# Patient Record
Sex: Female | Born: 2000 | Race: White | Hispanic: Yes | Marital: Single | Smoking: Never smoker
Health system: Southern US, Community
[De-identification: ages and names within clinical notes are randomized; demographics above are authoritative.]

## PROBLEM LIST (undated history)

## (undated) ENCOUNTER — Ambulatory Visit: Payer: Self-pay | Source: Home / Self Care

## (undated) DIAGNOSIS — Z789 Other specified health status: Secondary | ICD-10-CM

---
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
## 2019-07-03 IMAGING — CR DG CHEST 2V
2 series · 2 of 2 positions shown · non-contrast
Comparison: none

CLINICAL DATA: Cough several days, crackles on right heard on exam

EXAM:
CHEST - 2 VIEW

[w chest pa]
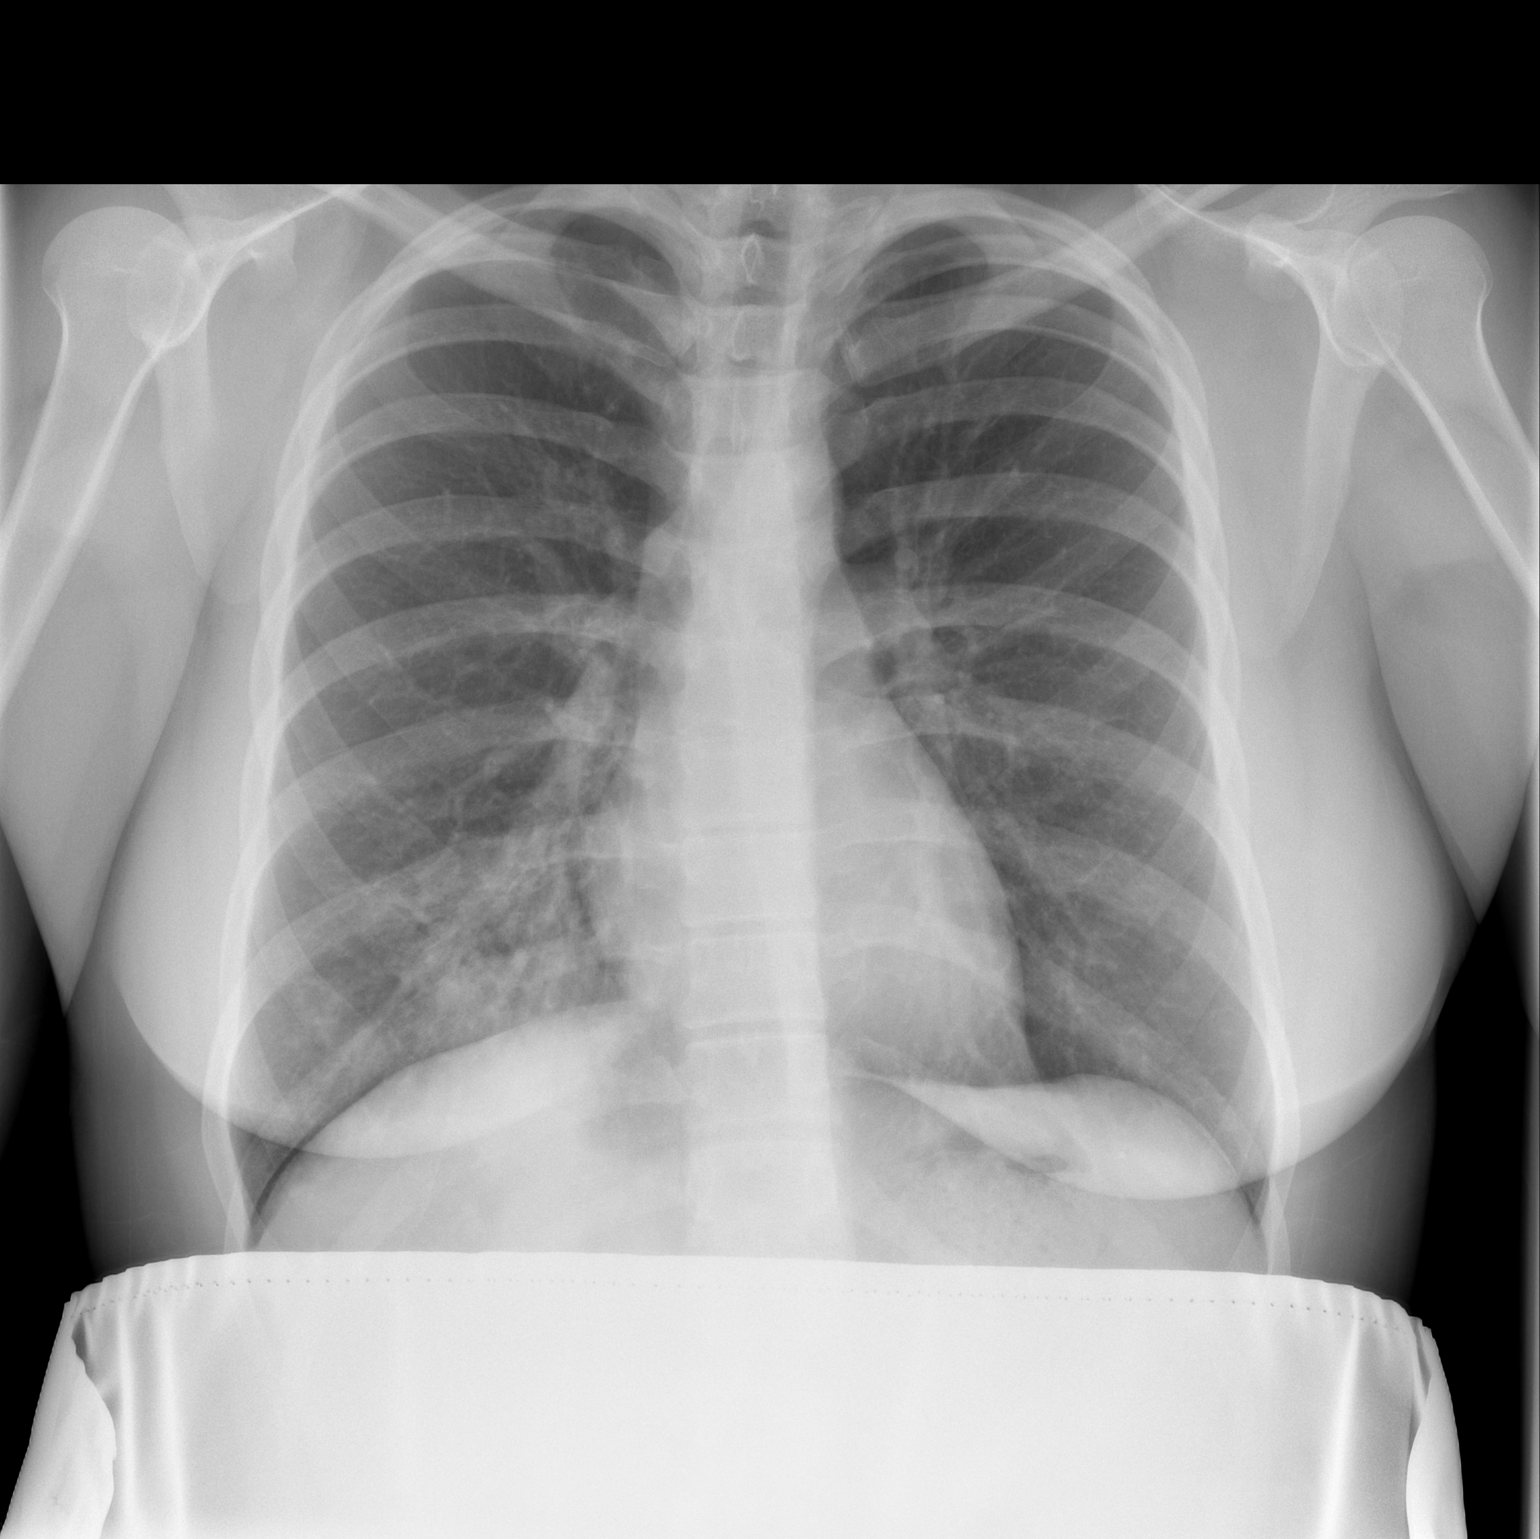

[w chest lat]
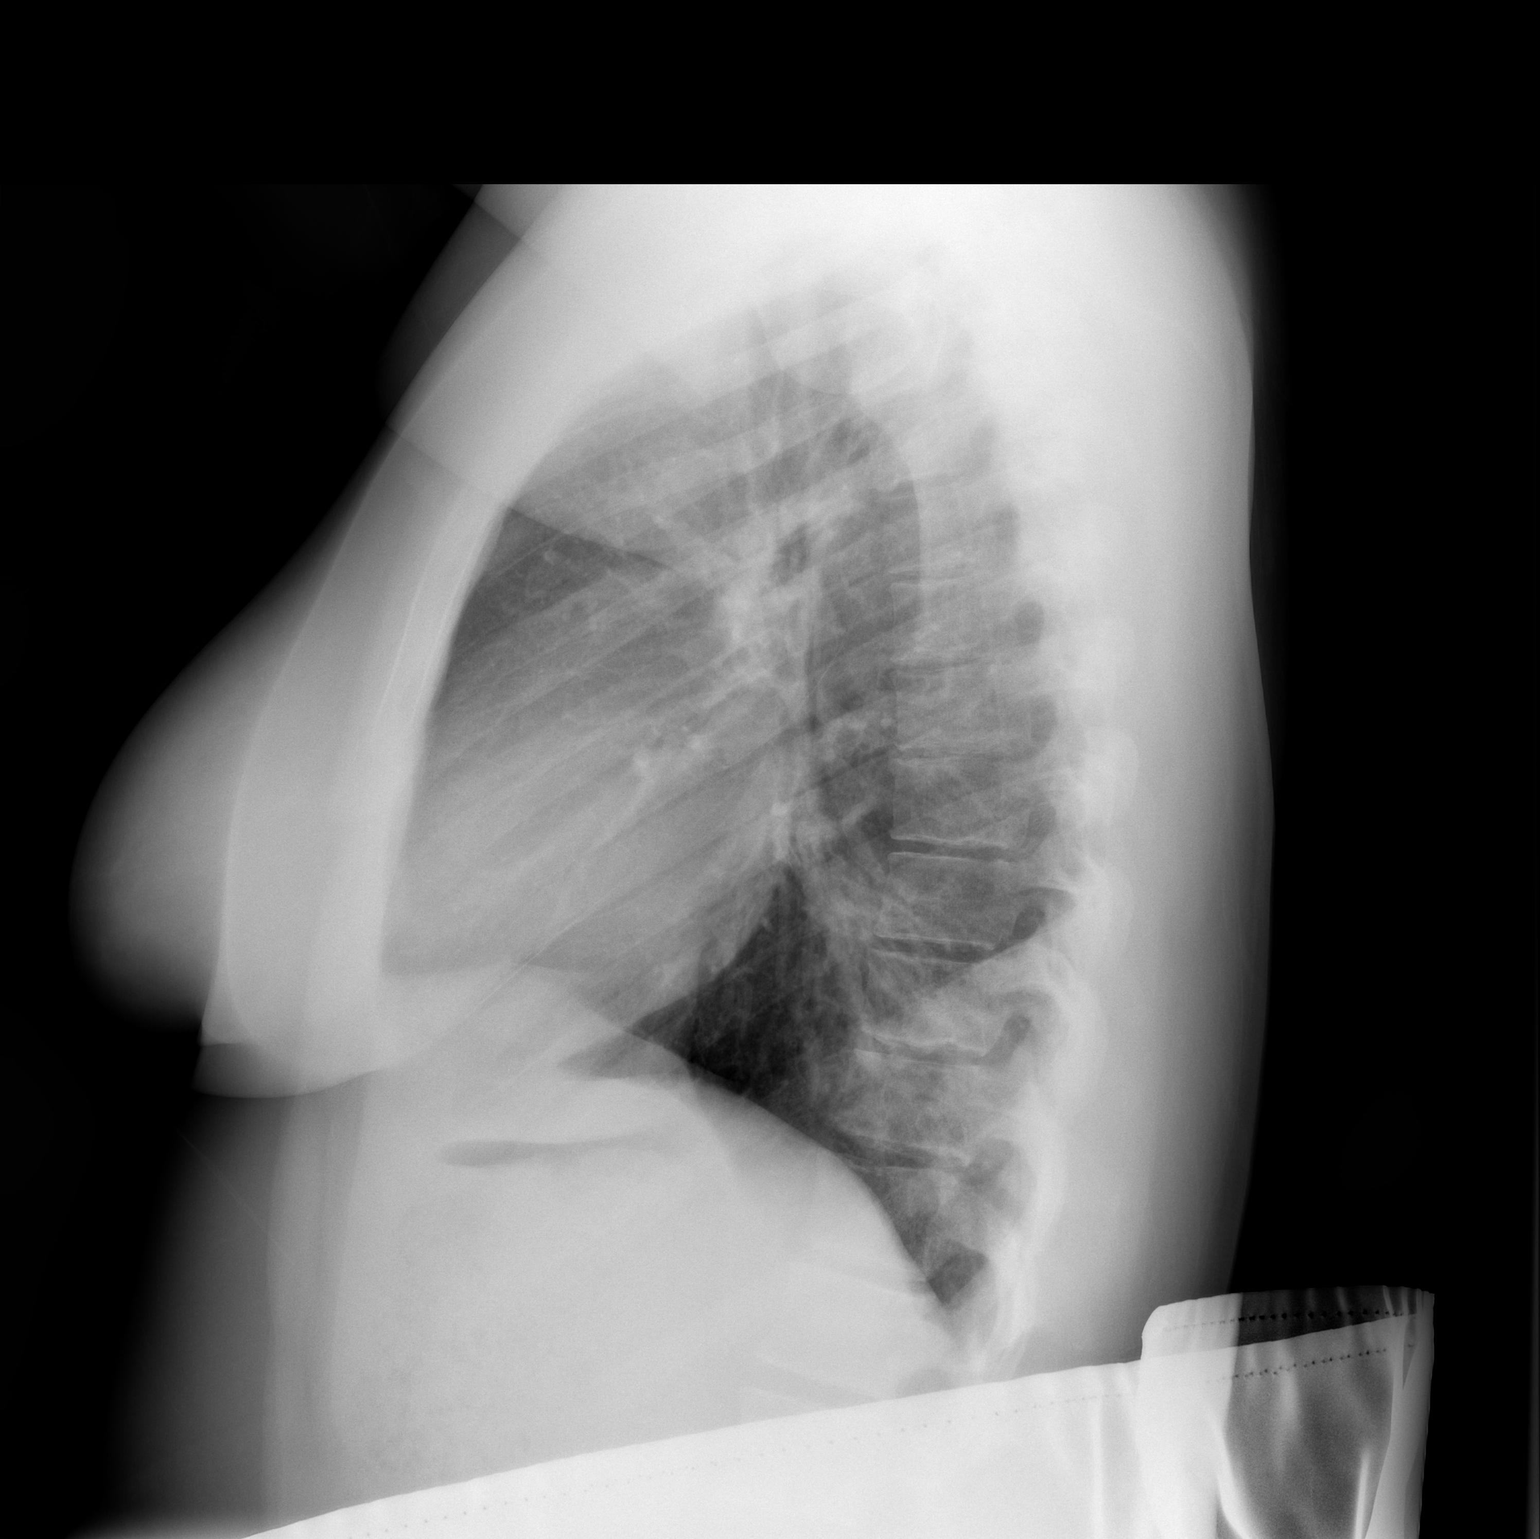

[2 of 2 positions shown; findings below may reference images not displayed]

FINDINGS: Airspace consolidation in the posterior basal segment right lower
lobe. Left lung clear.

Heart size and mediastinal contours are within normal limits.

No effusion.

Visualized bones unremarkable.
IMPRESSION: Posterior right lower lobe pneumonia

## 2020-07-07 ENCOUNTER — Emergency Department (HOSPITAL_COMMUNITY)
Admission: EM | Admit: 2020-07-07 | Discharge: 2020-07-07 | Disposition: A | Discharge status: Home / Self Care | Payer: 59 | Attending: Emergency Medicine | Admitting: Emergency Medicine

## 2020-07-07 ENCOUNTER — Encounter (HOSPITAL_COMMUNITY): Payer: Self-pay | Admitting: Pharmacy Technician

## 2020-07-07 DIAGNOSIS — K0889 Other specified disorders of teeth and supporting structures: Secondary | ICD-10-CM | POA: Diagnosis present

## 2020-07-07 MED ORDER — PENICILLIN V POTASSIUM 500 MG PO TABS: 500.0000 mg | ORAL_TABLET | Freq: Four times a day (QID) | ORAL | 0 refills | Status: AC

## 2020-07-07 NOTE — ED Provider Notes (Signed)
[facility] EMERGENCY DEPARTMENT Provider Note   CSN: [id number] Arrival date & time: 07/07/20  1716     History Chief Complaint  Patient presents with  . Dental Pain    Michaela Robinson is a 20 y.o. female.  20 year old female presents with complaint of right and left lower jaw pain, states her wisdom teeth are breaking through and are very painful.  Denies swelling, fevers, injury.        Past Medical History:  Diagnosis Date  . Medical history non-contributory     Patient Active Problem List   Diagnosis Date Noted  . GAD (generalized anxiety disorder) 12/25/2017  . MDD (major depressive disorder) 12/25/2017  . Severe recurrent major depression without psychotic features (HCC) 08/17/2017    No past surgical history on file.   OB History   No obstetric history on file.     No family history on file.  Social History   Tobacco Use  . Smoking status: Never Smoker  . Smokeless tobacco: Never Used  Vaping Use  . Vaping Use: Never used  Substance Use Topics  . Alcohol use: Not Currently  . Drug use: Not Currently    Home Medications Prior to Admission medications   Medication Sig Start Date End Date Taking? Authorizing Provider  penicillin v potassium (VEETID) 500 MG tablet Take 1 tablet (500 mg total) by mouth 4 (four) times daily for 10 days. 07/07/20 07/17/20 Yes Jeannie Fend, PA-C  hydrOXYzine (VISTARIL) 25 MG capsule Take 1 capsule (25 mg total) by mouth every 8 (eight) hours as needed. 10/18/18   Cherie Ouch, PA-C    Allergies    Patient has no known allergies.  Review of Systems   Review of Systems  Constitutional: Negative for chills and fever.  HENT: Positive for dental problem. Negative for trouble swallowing and voice change.   Gastrointestinal: Negative for vomiting.  Musculoskeletal: Negative for neck pain and neck stiffness.  Skin: Negative for rash and wound.  Allergic/Immunologic: Negative for immunocompromised  state.  Neurological: Negative for headaches.  Hematological: Negative for adenopathy.  Psychiatric/Behavioral: Negative for confusion.  All other systems reviewed and are negative.   Physical Exam Updated Vital Signs BP 131/85 (BP Location: Right Arm)   Pulse (!) 106   Temp 98.2 F (36.8 C) (Oral)   Resp 15   SpO2 100%   Physical Exam Vitals and nursing note reviewed.  Constitutional:      General: She is not in acute distress.    Appearance: She is well-developed. She is not diaphoretic.  HENT:     Head: Normocephalic and atraumatic.     Jaw: No trismus.     Nose: Nose normal.     Mouth/Throat:     Mouth: Mucous membranes are moist.     Pharynx: No oropharyngeal exudate or posterior oropharyngeal erythema.     Comments: Right and left lower wisdom teeth visible, no obvious abscess Pulmonary:     Effort: Pulmonary effort is normal.  Musculoskeletal:     Cervical back: Neck supple.  Lymphadenopathy:     Cervical: No cervical adenopathy.  Skin:    General: Skin is warm and dry.  Neurological:     Mental Status: She is alert and oriented to person, place, and time.  Psychiatric:        Behavior: Behavior normal.     ED Results / Procedures / Treatments   Labs (all labs ordered are listed, but only abnormal results are displayed)  Labs Reviewed - No data to display  EKG None  Radiology No results found.  Procedures Procedures   Medications Ordered in ED Medications - No data to display  ED Course  I have reviewed the triage vital signs and the nursing notes.  Pertinent labs & imaging results that were available during my care of the patient were reviewed by me and considered in my medical decision making (see chart for details).  Clinical Course as of 07/07/20 1805  Mon Jul 07, 2020  3278 20 year old female with complaint of dental pain without fever, trauma, drainage.  Pain appears related to eruption of lower wisdom teeth.  Patient is referred to oral  surgery with dental referral guide.  Given prescription for penicillin. [LM]    Clinical Course User Index [LM] Alden Hipp   MDM Rules/Calculators/A&P                          Final Clinical Impression(s) / ED Diagnoses Final diagnoses:  Pain, dental    Rx / DC Orders ED Discharge Orders         Ordered    penicillin v potassium (VEETID) 500 MG tablet  4 times daily        07/07/20 1753           Jeannie Fend, PA-C 07/07/20 1805    Milagros Loll, MD 07/08/20 2134

## 2020-07-07 NOTE — ED Notes (Signed)
Patient Alert and oriented to baseline. Stable and ambulatory to baseline. Patient verbalized understanding of the discharge instructions.  Patient belongings were taken by the patient.   

## 2020-07-07 NOTE — ED Triage Notes (Signed)
Pt here with R sided dental pain to where her wisdom teeth were. Pt states she was supposed to have the pulled a while ago.

## 2020-11-21 ENCOUNTER — Other Ambulatory Visit: Payer: Self-pay

## 2020-11-21 ENCOUNTER — Emergency Department (HOSPITAL_COMMUNITY)
Admission: EM | Admit: 2020-11-21 | Discharge: 2020-11-21 | Disposition: A | Discharge status: Home / Self Care | Payer: 59 | Attending: Physician Assistant | Admitting: Physician Assistant

## 2020-11-21 ENCOUNTER — Encounter (HOSPITAL_COMMUNITY): Payer: Self-pay

## 2020-11-21 DIAGNOSIS — N939 Abnormal uterine and vaginal bleeding, unspecified: Secondary | ICD-10-CM | POA: Diagnosis present

## 2020-11-21 DIAGNOSIS — R110 Nausea: Secondary | ICD-10-CM | POA: Insufficient documentation

## 2020-11-21 DIAGNOSIS — Z5321 Procedure and treatment not carried out due to patient leaving prior to being seen by health care provider: Secondary | ICD-10-CM | POA: Diagnosis not present

## 2020-11-21 DIAGNOSIS — N9489 Other specified conditions associated with female genital organs and menstrual cycle: Secondary | ICD-10-CM | POA: Diagnosis not present

## 2020-11-21 DIAGNOSIS — R519 Headache, unspecified: Secondary | ICD-10-CM | POA: Diagnosis not present

## 2020-11-21 DIAGNOSIS — R109 Unspecified abdominal pain: Secondary | ICD-10-CM | POA: Insufficient documentation

## 2020-11-21 LAB — CBC WITH DIFFERENTIAL/PLATELET
Abs Immature Granulocytes: 0.02 10*3/uL (ref 0.00–0.07)
Basophils Absolute: 0.1 10*3/uL (ref 0.0–0.1)
Basophils Relative: 1 %
Eosinophils Absolute: 0.1 10*3/uL (ref 0.0–0.5)
Eosinophils Relative: 1 %
HCT: 38.5 % (ref 36.0–46.0)
Hemoglobin: 11.8 g/dL — ABNORMAL LOW (ref 12.0–15.0)
Immature Granulocytes: 0 %
Lymphocytes Relative: 29 %
Lymphs Abs: 2.4 10*3/uL (ref 0.7–4.0)
MCH: 25.4 pg — ABNORMAL LOW (ref 26.0–34.0)
MCHC: 30.6 g/dL (ref 30.0–36.0)
MCV: 82.8 fL (ref 80.0–100.0)
Monocytes Absolute: 0.5 10*3/uL (ref 0.1–1.0)
Monocytes Relative: 6 %
Neutro Abs: 5.3 10*3/uL (ref 1.7–7.7)
Neutrophils Relative %: 63 %
Platelets: 362 10*3/uL (ref 150–400)
RBC: 4.65 MIL/uL (ref 3.87–5.11)
RDW: 15 % (ref 11.5–15.5)
WBC: 8.4 10*3/uL (ref 4.0–10.5)
nRBC: 0 % (ref 0.0–0.2)

## 2020-11-21 LAB — BASIC METABOLIC PANEL
Anion gap: 8 (ref 5–15)
BUN: 7 mg/dL (ref 6–20)
CO2: 20 mmol/L — ABNORMAL LOW (ref 22–32)
Calcium: 8.6 mg/dL — ABNORMAL LOW (ref 8.9–10.3)
Chloride: 106 mmol/L (ref 98–111)
Creatinine, Ser: 0.69 mg/dL (ref 0.44–1.00)
GFR, Estimated: >60 mL/min (ref >60)
Glucose, Bld: 95 mg/dL (ref 70–99)
Potassium: 3.3 mmol/L — ABNORMAL LOW (ref 3.5–5.1)
Sodium: 134 mmol/L — ABNORMAL LOW (ref 135–145)

## 2020-11-21 LAB — I-STAT BETA HCG BLOOD, ED (MC, WL, AP ONLY): I-stat hCG, quantitative: <5 m[IU]/mL (ref <5)

## 2020-11-21 NOTE — ED Notes (Signed)
This tech was informed by [facility] staff that patient left the emergency room.

## 2020-11-21 NOTE — ED Provider Notes (Signed)
Emergency Medicine Provider Triage Evaluation Note  Michaela Robinson , a 20 y.o. female  was evaluated in triage.  Pt complains of vaginal bleeding for the last 2 weeks.  Patient states that she her period ended about 3 weeks ago and now she has been bleeding again.  Sometimes passing large clots.  Changing a tampon about every 2 hours or so.  Endorses occasional abdominal cramping and nausea.  She also endorses some occasional dizziness.  She is on oral birth control reports being compliant with this.  No odors or vaginal discharge.  Review of Systems  Positive: As above  Negative: As above   Physical Exam  There were no vitals taken for this visit. Gen:   Awake, no distress   Resp:  Normal effort  MSK:   Moves extremities without difficulty  Other:  Abdomen soft, nontender  Medical Decision Making  Medically screening exam initiated at 4:10 PM.  Appropriate orders placed.  [name]-Dajohn Ellender Termini was informed that the remainder of the evaluation will be completed by another provider, this initial triage assessment does not replace that evaluation, and the importance of remaining in the ED until their evaluation is complete.     Mare Ferrari, PA-C 11/21/20 1611    Gerhard Munch, MD 11/21/20 2350

## 2020-11-21 NOTE — ED Triage Notes (Addendum)
Pt reports vaginal bleeding for the past 7 days, pt started her period and it ended 3 weeks ago and now she is bleeding again. Pt taking oral birth control. Some abd cramping and nausea. Pt also reports headache for the past 3 days

## 2021-07-14 ENCOUNTER — Ambulatory Visit (HOSPITAL_COMMUNITY)
Admission: EM | Admit: 2021-07-14 | Discharge: 2021-07-14 | Disposition: A | Discharge status: Home / Self Care | Payer: No Payment, Other | Attending: Psychiatry | Admitting: Psychiatry

## 2021-07-14 DIAGNOSIS — Z9152 Personal history of nonsuicidal self-harm: Secondary | ICD-10-CM | POA: Insufficient documentation

## 2021-07-14 DIAGNOSIS — F332 Major depressive disorder, recurrent severe without psychotic features: Secondary | ICD-10-CM | POA: Insufficient documentation

## 2021-07-14 NOTE — ED Provider Notes (Addendum)
Behavioral Health Urgent Care Medical Screening Exam ? ?Patient Name: Michaela Robinson ?MRN: [id number] ?Date of Evaluation: 07/14/21 ?Chief Complaint:   ?Diagnosis:  ?Final diagnoses:  ?Severe recurrent major depression without psychotic features (HCC)  ? ? ?History of Present illness: Michaela Robinson is a 21 y.o. female Michaela Robinson 20 y.o., female patient presented to [facility] as a walk in alone.  Patient states she was seen earlier at [facility] and was recommended to come to [facility] for assessment ? ?Per RN Vania Rea informed this writer verbally that [facility] staff called this facility in regards to patient to see if there was bed availability.  Per RN she informed [facility] staff person that was on the phone that each patient receives a crisis assessment and at that time is when disposition is made. ? ?Michaela Robinson, 21 y.o., female patient seen face to face by this provider, consulted with Dr. Bronwen Betters; and chart reviewed on 07/14/21.  Per patient she has no current outpatient psychiatric services in place and she is not taking any medications.  Per chart review patient has had services in the past with [facility] for medication management and therapy.  ? ?Patient reports that she and her boyfriend broke up this morning.  She went to [facility] in [place] during walk-in hours to restart medications and therapy.  During the assessment patient reports that she was told to present to [facility] and there would be a bed for her.  Patient denies telling [facility] that she was suicidal or stating any safety concerns while she was being assessed.  States, "I only told them that I get tired of being in my situation but I would never do anything to hurt myself".  Reports she has been kicked out of her father's home.  She has been living with a friend and she is unable to help her pay rent.  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  She decreased her work hours to part-time but due to the  difficulty of working part-time she decreased her hours even further and now is considered an occasional employee.  She also has a 61-year-old child that does not live with her.  States the child's father physically has the child and will not let her see him.  Reports there is no custody agreement in place.  She is upset that she does not have any money and states she spent her last $40 on an [place] ride to get from [place] to [employer].  In addition she packed all of her clothes and left her cell phone behind because she thought she would be admitted.  She endorses occasional alcohol and marijuana use.  She denies all other substance use. ? ?During evaluation Michaela Robinson is observed sitting in the assessment room with her legs pulled up to her chest. Initially she is not tearful but upon entering room she immediatly begins to cry.  She is alert/oriented x4.  She makes fleeting eye contact.  Her speech is initially at a normal rate and tone.  She endorses increased anxiety and depression with feelings of hopelessness, helplessness, decreased motivation, decreased focus, decreased appetite and sleep.  Reports she sleeps roughly 4 hours per night.  When asking patient if there is a timeframe when her symptoms began to worsen, she states "I have been this way my whole life".  Reports she is unable to keep friends, they always get mad at her.  She states nothing ever works  out for her and no one ever wants to help her.  Reports medications were not helpful when she took them nor was therapy.  States today after her boyfriend broke up with her she cut her left inner forearm.  She has 2 superficial (very light) scratches on her arm.  She denies this was a suicide attempt.  States she, "cut to cope".  She has a history of self-harm/cutting but has not done so in years.  She adamantly denies SI/HI/AVH.  States, "I would never hurt myself I am too afraid to ever try anything like that".  She contracts for safety and denies  any access to firearms/weapons.  She denies any past suicide attempts.  Objectively she does not appear to be responding to internal/external stimuli.  She exhibits no signs of mania, preoccupation, distractibility, or psychosis.  She denies paranoia and delusional thought content. ? ?Discussed with patient that she does not meet inpatient psychiatric admission criteria.  She became extremely upset.  She was tearful and began raising her voice and yelling at this Clinical research associatewriter.  States, "I need help with my mental health right now".  Discussed with patient that therapy is not offered in the urgent care setting.  Discussed the option of being admitted to the continuous assessment unit for overnight to restart medications.  Informed patient it would be an admission for 1 night and that therapy is not offered in the continuous assessment unit.  She began yelling at this writer again and stated, "well that will do me no good and this was a waste of my time".  She then stated, "you are of no help in this place sucks, and I say that with no disrespect".  She also ask where  is she supposed to go if she cannot stay here.  Tried to discuss outpatient psychiatric resources with patient but she refused to listen.  [facility]. Patient then requested to be discharged because we could not help her.  Patient stated she would call her ex-boyfriend to come pick her up.  This Clinical research associatewriter was informed later that patient was in the lobby upset and angry that no one could come pick her up.  A taxi voucher was provided to transport patient back to [place]. ? ?Patient was rude and disrespectful to this Clinical research associatewriter and staff upon discharge.  At no point during the assessment did patient express any thoughts or intent to hurt herself or others.  During the evaluation she focused on obtaining admission.  It is possible patient could be seeking secondary gain.  She also declined for any collateral to be  contacted. ? ?Psychiatric Specialty Exam ? ?Presentation  ?General Appearance:Casual ? ?Eye Contact:Fleeting ? ?Speech:Clear and Coherent; Normal Rate (loud  when she became upset) ? ?Speech Volume:Normal ? ?Handedness:Right ? ? ?Mood and Affect  ?Mood:Anxious; Depressed ?Affect:Tearful; Congruent ? ?Thought Process  ?Thought Processes:Coherent ?Descriptions of Associations:Intact ? ?Orientation:Full (Time, Place and Person) ? ?Thought Content:Logical ?   Hallucinations:None ? ?Ideas of Reference:None ? ?Suicidal Thoughts:No ? ?Homicidal Thoughts:No ? ? ?Sensorium  ?Memory:Immediate Good; Recent Good; Remote Good ?Judgment:Good ?Insight:Good ? ?Executive Functions  ?Concentration:Good ?Attention Span:Good ?Recall:Good ?Fund of Knowledge:Good ?Language:Good ? ?Psychomotor Activity  ?Psychomotor Activity:Normal ? ?Assets  ?Assets:Leisure Time; Physical Health; Resilience; Desire for Improvement ? ?Sleep  ?Sleep:Fair ?Number of hours: 4 ? ?No data recorded ? ?Physical Exam: ?Physical Exam ?Vitals and nursing note reviewed.  ?Constitutional:   ?   General: She is not in acute  distress. ?   Appearance: Normal appearance. She is not ill-appearing.  ?HENT:  ?   Head: Normocephalic.  ?Eyes:  ?   General:     ?   Right eye: No discharge.     ?   Left eye: No discharge.  ?   Conjunctiva/sclera: Conjunctivae normal.  ?Cardiovascular:  ?   Rate and Rhythm: Normal rate.  ?Pulmonary:  ?   Effort: Pulmonary effort is normal.  ?Musculoskeletal:     ?   General: Normal range of motion.  ?   Cervical back: Normal range of motion.  ?Skin: ?   Coloration: Skin is not jaundiced or pale.  ?Neurological:  ?   Mental Status: She is alert and oriented to person, place, and time.  ?Psychiatric:     ?   Attention and Perception: Attention and perception normal.     ?   Mood and Affect: Mood is anxious and depressed. Affect is tearful.     ?   Speech: Speech normal.     ?   Behavior: Behavior is agitated. Behavior is cooperative.     ?    Thought Content: Thought content normal.     ?   Cognition and Memory: Cognition normal.     ?   Judgment: Judgment is impulsive.  ? ?Review of Systems  ?Constitutional: Negative.   ?HENT: Negative.    ?Eyes: Negative.   ?Respiratory

## 2021-07-14 NOTE — Discharge Instructions (Addendum)
The suicide prevention education provided includes the following: ?Suicide risk factors ?Suicide prevention and interventions ?National Suicide Hotline telephone number ?[facility] assessment telephone number ?[facility] Emergency Assistance 911 ?[place] and/or Residential Mobile Crisis Unit telephone number ?  ?Request made of family/significant other to: ?Remove weapons (e.g., guns, rifles, knives), all items previously/currently identified as safety concern.   ?Remove drugs/medications (over the counter, prescriptions, illicit drugs), all items previously/currently identified as a safety concern.  ? ?Please present to one of the following facilities to start medication management and therapy services:  ? ?[facility]  ?[address],  ?[place], [place] [address] ?[phone] ?Please note: Walk in hours are Mon-Fri @ 0745 am.  ? ? ?[facility]  ?[address],  ?[place], [place] [address] ?[phone] ?Please note: In order to start services, please present for walk in hours Mon-Fri at 0745 am.   ?

## 2021-07-14 NOTE — ED Notes (Signed)
Pt discharged with resources in hand provided on AVS. Safety maintained. ?

## 2023-04-16 ENCOUNTER — Other Ambulatory Visit: Payer: Self-pay

## 2023-04-16 DIAGNOSIS — F10120 Alcohol abuse with intoxication, uncomplicated: Secondary | ICD-10-CM | POA: Insufficient documentation

## 2023-04-16 DIAGNOSIS — F4323 Adjustment disorder with mixed anxiety and depressed mood: Secondary | ICD-10-CM | POA: Insufficient documentation

## 2023-04-16 DIAGNOSIS — F411 Generalized anxiety disorder: Secondary | ICD-10-CM | POA: Insufficient documentation

## 2023-04-16 DIAGNOSIS — F332 Major depressive disorder, recurrent severe without psychotic features: Secondary | ICD-10-CM | POA: Insufficient documentation

## 2023-04-16 DIAGNOSIS — F4310 Post-traumatic stress disorder, unspecified: Secondary | ICD-10-CM | POA: Insufficient documentation

## 2023-04-16 DIAGNOSIS — Y906 Blood alcohol level of 120-199 mg/100 ml: Secondary | ICD-10-CM | POA: Insufficient documentation

## 2023-04-16 LAB — CBC
HCT: 44 % (ref 36.0–46.0)
Hemoglobin: 14.8 g/dL (ref 12.0–15.0)
MCH: 29.3 pg (ref 26.0–34.0)
MCHC: 33.6 g/dL (ref 30.0–36.0)
MCV: 87.1 fL (ref 80.0–100.0)
Platelets: 122 10*3/uL — ABNORMAL LOW (ref 150–400)
RBC: 5.05 MIL/uL (ref 3.87–5.11)
RDW: 12 % (ref 11.5–15.5)
WBC: 12.9 10*3/uL — ABNORMAL HIGH (ref 4.0–10.5)
nRBC: 0 % (ref 0.0–0.2)

## 2023-04-16 LAB — ACETAMINOPHEN LEVEL: Acetaminophen (Tylenol), Serum: <10 ug/mL — ABNORMAL LOW (ref 10–30)

## 2023-04-16 LAB — SALICYLATE LEVEL: Salicylate Lvl: <7 mg/dL — ABNORMAL LOW (ref 7.0–30.0)

## 2023-04-16 LAB — ETHANOL: Alcohol, Ethyl (B): 176 mg/dL — ABNORMAL HIGH (ref <10)

## 2023-04-16 NOTE — ED Triage Notes (Signed)
Pt to ed from home via POV for panic attack. Pt states "I felt like my boyfriend didn't wanna be with me anymore tonight". The boyfriends mom is here with patient, advised they got into some discussions and the patient had been drinking tonight. Pt is crying and hard to console in triage. Pt is alert and oriented. Pt denies SI and HI in triage.

## 2023-04-17 ENCOUNTER — Emergency Department
Admission: EM | Admit: 2023-04-17 | Discharge: 2023-04-17 | Disposition: A | Discharge status: Home / Self Care | Payer: 59 | Attending: Emergency Medicine | Admitting: Emergency Medicine

## 2023-04-17 DIAGNOSIS — F332 Major depressive disorder, recurrent severe without psychotic features: Secondary | ICD-10-CM | POA: Diagnosis not present

## 2023-04-17 DIAGNOSIS — F4310 Post-traumatic stress disorder, unspecified: Secondary | ICD-10-CM | POA: Diagnosis not present

## 2023-04-17 DIAGNOSIS — F4323 Adjustment disorder with mixed anxiety and depressed mood: Secondary | ICD-10-CM

## 2023-04-17 DIAGNOSIS — F411 Generalized anxiety disorder: Secondary | ICD-10-CM | POA: Diagnosis present

## 2023-04-17 DIAGNOSIS — F10929 Alcohol use, unspecified with intoxication, unspecified: Secondary | ICD-10-CM

## 2023-04-17 LAB — COMPREHENSIVE METABOLIC PANEL
ALT: 19 U/L (ref 0–44)
AST: 18 U/L (ref 15–41)
Albumin: 4.5 g/dL (ref 3.5–5.0)
Alkaline Phosphatase: 52 U/L (ref 38–126)
Anion gap: 9 (ref 5–15)
BUN: 13 mg/dL (ref 6–20)
CO2: 24 mmol/L (ref 22–32)
Calcium: 9.1 mg/dL (ref 8.9–10.3)
Chloride: 111 mmol/L (ref 98–111)
Creatinine, Ser: 0.69 mg/dL (ref 0.44–1.00)
GFR, Estimated: >60 mL/min (ref >60)
Glucose, Bld: 96 mg/dL (ref 70–99)
Potassium: 3.4 mmol/L — ABNORMAL LOW (ref 3.5–5.1)
Sodium: 144 mmol/L (ref 135–145)
Total Bilirubin: 0.6 mg/dL (ref 0.0–1.2)
Total Protein: 7.4 g/dL (ref 6.5–8.1)

## 2023-04-17 LAB — URINE DRUG SCREEN, QUALITATIVE (ARMC ONLY)
Amphetamines, Ur Screen: NOT DETECTED
Barbiturates, Ur Screen: NOT DETECTED
Benzodiazepine, Ur Scrn: NOT DETECTED
Cannabinoid 50 Ng, Ur ~~LOC~~: POSITIVE — AB
Cocaine Metabolite,Ur ~~LOC~~: NOT DETECTED
MDMA (Ecstasy)Ur Screen: NOT DETECTED
Methadone Scn, Ur: NOT DETECTED
Opiate, Ur Screen: NOT DETECTED
Phencyclidine (PCP) Ur S: NOT DETECTED
Tricyclic, Ur Screen: NOT DETECTED

## 2023-04-17 LAB — POC URINE PREG, ED: Preg Test, Ur: NEGATIVE

## 2023-04-17 MED ORDER — HYDROXYZINE HCL 25 MG PO TABS: 25.0000 mg | ORAL_TABLET | Freq: Four times a day (QID) | ORAL | 0 refills | Status: AC | PRN

## 2023-04-17 MED ORDER — LORAZEPAM 1 MG PO TABS
1.0000 mg | ORAL_TABLET | Freq: Once | ORAL | Status: AC
  Administered 2023-04-17: 1 mg via ORAL
  Filled 2023-04-17: qty 1

## 2023-04-17 MED ORDER — NICOTINE 14 MG/24HR TD PT24
14.0000 mg | MEDICATED_PATCH | Freq: Once | TRANSDERMAL | Status: DC
  Administered 2023-04-17: 14 mg via TRANSDERMAL
  Filled 2023-04-17: qty 1
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
## 2023-04-17 NOTE — ED Provider Notes (Addendum)
[facility] Provider Note    Event Date/Time   First MD Initiated Contact with Patient 04/17/23 [phone]     (approximate)   History   Panic Attack   HPI Michaela Robinson is a 23 y.o. female  who reports having a history of anxiety, depression, and PTSD "with abandonment issues".  She presents tonight accompanied by her boyfriend's mother after she "had too much to drink" earlier this evening and then "freaked out" over concern that her boyfriend was not interested in her any more.  The boyfriend's mother who is present in the room witnessed the event and said that the patient was "out of control", crying and yelling, but they were finally able to convince her to come to the ED voluntarily.  The patient is awake and alert, smiling and laughing and try to be conversant but then she will burst into tears and is obviously very upset.  She says she has had previous psychiatric hospitalizations and previously was on hydroxyzine and Lexapro, but she is no longer able to afford her medications and has not been on them for a while.  Similarly she has no outpatient psychiatric resources including no therapist because she cannot afford it.  She is adamantly denying suicidal ideation and homicidal ideation, but states that she does not know what she is going to do because she knows she needs help and she does not know where to get it or what to do.  Her boyfriend's mother is obviously very concerned about her as well.  The patient has no medical complaints or concerns at this time.     Physical Exam   Triage Vital Signs: ED Triage Vitals [04/16/23 2310]  Encounter Vitals Group     BP (!) 140/88     Systolic BP Percentile      Diastolic BP Percentile      Pulse Rate (!) 120     Resp (!) 22     Temp 98.3 F (36.8 C)     Temp Source Oral     SpO2 100 %     Weight      Height 1.651 m (5\' 5" )     Head Circumference      Peak Flow      Pain Score 0     Pain Loc      Pain  Education      Exclude from Growth Chart     Most recent vital signs: Vitals:   04/16/23 2310 04/17/23 0240  BP: (!) 140/88 (!) 112/57  Pulse: (!) 120 (!) 105  Resp: (!) 22 18  Temp: 98.3 F (36.8 C) 98.7 F (37.1 C)  SpO2: 100% 98%    General: Awake, alert, communicative. CV:  Good peripheral perfusion.  Mild borderline tachycardia Resp:  Normal effort. Speaking easily and comfortably, no accessory muscle usage nor intercostal retractions.   Abd:  No distention.  Other:  Labile emotions, rapidly alternating between laughing and an over-the-top fashion and crying.  Denies SI and HI.  Admits to ongoing depression and anxiety.   ED Results / Procedures / Treatments   Labs (all labs ordered are listed, but only abnormal results are displayed) Labs Reviewed  ETHANOL - Abnormal; Notable for the following components:      Result Value   Alcohol, Ethyl (B) 176 (*)    All other components within normal limits  SALICYLATE LEVEL - Abnormal; Notable for the following components:   Salicylate Lvl <7.0 (*)  All other components within normal limits  ACETAMINOPHEN LEVEL - Abnormal; Notable for the following components:   Acetaminophen (Tylenol), Serum <10 (*)    All other components within normal limits  CBC - Abnormal; Notable for the following components:   WBC 12.9 (*)    Platelets 122 (*)    All other components within normal limits  URINE DRUG SCREEN, QUALITATIVE ([facility] ONLY) - Abnormal; Notable for the following components:   Cannabinoid 50 Ng, Ur [place]on POSITIVE (*)    All other components within normal limits  COMPREHENSIVE METABOLIC PANEL - Abnormal; Notable for the following components:   Potassium 3.4 (*)    All other components within normal limits  POC URINE PREG, ED      PROCEDURES:  Critical Care performed: No  Procedures    IMPRESSION / MDM / ASSESSMENT AND PLAN / ED COURSE  I reviewed the triage vital signs and the nursing notes.                               Differential diagnosis includes, but is not limited to, adjustment disorder, mood disorder, personality disorder, depression, anxiety, bipolar disorder, intoxication/medication or drug side effect.  Patient's presentation is most consistent with acute presentation with potential threat to life or bodily function.  Labs/studies ordered: As per protocol, I ordered the following labs as part of the patient's medical and psychiatric evaluation:  CBC, CMP, ethanol level, acetaminophen level, salicylate level, urine drug screen, urine pregnancy test.  Interventions/Medications given:  Medications  nicotine (NICODERM CQ - dosed in mg/24 hours) patch 14 mg (14 mg Transdermal Patch Applied 04/17/23 0404)  LORazepam (ATIVAN) tablet 1 mg (1 mg Oral Given 04/17/23 0342)    (Note:  hospital course my include additional interventions and/or labs/studies not listed above.)   Patient's lab work is reassuring (CMP still pending due to hemolysis).  Patient intoxicated with an ethanol level of 176.  However, she is otherwise stable and has no medical complaints or concerns.  She is medically cleared for psychiatric evaluation and disposition.  She does not meet IVC criteria, but I am concerned about her apparently worsening and labile behavior, known chronic psychiatric illness, prior psychiatric hospitalizations, and inability to afford her medications.  I believe that she would benefit from psychiatric consultation and possibly inpatient treatment.  The patient and the adult with her agrees with this plan.  The patient has been placed in psychiatric observation due to the need to provide a safe environment for the patient while obtaining psychiatric consultation and evaluation, as well as ongoing medical and medication management to treat the patient's condition.  The patient has not been placed under full IVC at this time.    Clinical Course as of 04/17/23 0445  Sun Apr 17, 2023  0315 Patient  evaluated by Lerry Liner, psychiatry NP.  She agrees the patient would benefit from inpatient psychiatric treatment.  Patient remains voluntary. [CF]  [phone] Patient has decided she wants to leave.  Multiple staff members have talked with her about this being her opportunity for help in the inpatient setting but she is declining it.  She is clinically sober, awake, alert, and has a capacity to make her own decisions.  I went back and talked with her as well.  She says she does not want to "be stuck here for 3 to 7 days".  She understands that she is declining the care that is being recommended  to her.  As an alternative I will give her information about outpatient resources including [facility].  The patient's boyfriend's mother is with her at bedside and is not saying anything but is apparently supportive of her plan to be discharged. [CF]    Clinical Course User Index [CF] Loleta Rose, MD     FINAL CLINICAL IMPRESSION(S) / ED DIAGNOSES   Final diagnoses:  Adjustment reaction with anxiety and depression  PTSD (post-traumatic stress disorder)  Alcoholic intoxication with complication (HCC)     Rx / DC Orders   ED Discharge Orders          Ordered    hydrOXYzine (ATARAX) 25 MG tablet  Every 6 hours PRN        04/17/23 0441             Note:  This document was prepared using Dragon voice recognition software and may include unintentional dictation errors.   Loleta Rose, MD 04/17/23 0981    Loleta Rose, MD 04/17/23 [phone]

## 2023-04-17 NOTE — Discharge Instructions (Addendum)
You have been seen in the Emergency Department (ED) today for a psychiatric complaint.  You have been evaluated by psychiatry and they recommended that you be admitted for additional care treatment, but your preference is to be discharged at this time.  You have the capacity to make your own decisions, so we recommend that you follow-up at [facility] at the next available opportunity at the phone number and address listed in this documentation.  We also included additional information about other outpatient clinics and resource facilities; although some of them or for substance abuse, there may be some other options available to you for counseling.  You can also work through your primary care provider for additional outpatient resources for mental health services.  Please return to the ED immediately if you have ANY thoughts of hurting yourself or anyone else, so that we may help you.  Please avoid alcohol and drug use.  We have provided a prescription for hydroxyzine since it has worked in the past.  Please use it only according to label instructions.  Follow up with your doctor and/or therapist as soon as possible regarding today's ED visit.   Please follow up any other recommendations and clinic appointments provided by the psychiatry team that saw you in the Emergency Department.

## 2023-04-17 NOTE — Consult Note (Signed)
[facility] Psychiatric Consult Initial  Patient Name: .Michaela Robinson  MRN: [id number]  DOB: [birth year]  Consult Order details:  Orders (From admission, onward)     Start     Ordered   04/17/23 0150  CONSULT TO CALL ACT TEAM       Ordering Provider: Loleta Rose, MD  Provider:  (Not yet assigned)  Question:  Reason for Consult?  Answer:  Psych consult   04/17/23 0150   04/17/23 0150  IP CONSULT TO PSYCHIATRY       Ordering Provider: Loleta Rose, MD  Provider:  (Not yet assigned)  Question Answer Comment  Consult Timeframe URGENT - requires response within 12 hours   URGENT timeframe requires provider to provider communication, has the provider to provider communication been completed Yes   Reason for Consult? labile emotions, depression, anxiety, alcohol intoxication   Contact phone number where the requesting provider can be reached [phone]      04/17/23 0150             Mode of Visit: Tele-visit Virtual Statement:TELE PSYCHIATRY ATTESTATION & CONSENT As the provider for this telehealth consult, I attest that I verified the patient's identity using two separate identifiers, introduced myself to the patient, provided my credentials, disclosed my location, and performed this encounter via a HIPAA-compliant, real-time, face-to-face, two-way, interactive audio and video platform and with the full consent and agreement of the patient (or guardian as applicable.) Patient physical location: [facility]. Telehealth provider physical location: home office in state of [place].   Video start time:   Video end time:      Psychiatry Consult Evaluation  Service Date: April 17, 2023 LOS:  LOS: 0 days  Chief Complaint MDD  Primary Psychiatric Diagnoses  Severe recurrent major depression without psychotic features (HCC) GAD (generalized anxiety disorder)  Assessment  Michaela Robinson is a 23 y.o. female admitted: Presented to the [facility] [address] 04/17/2023 12:57 AM for erratic and depressive behavior. She  carries the psychiatric diagnoses of MDD, PTSD and anxiety.  Patient states that no one loves her and she would most likely be better off dead.  She is tearful and speaking loudly during the assessment.   Per chart review, patient has a hx of MDD, PTSD, and anxiety.     She has a history of self-harm/cutting. Objectively she does not appear to be responding to internal/external stimuli.    Per TTS, during assessment patient appears alert and oriented x4, cooperative but anxious, labile and depressed. During the assessment patient's mood often changed patient would present tearful, to angry and irritable, to calm while answering the questions. Patient is presenting with her boyfriend's mother. Patient reports "I have a shitty life, nobody gives a crap about me, my boyfriend would rather be asleep than be here with me." "I'm looking for the truth they are perfectly fine with not caring, I came here because everybody said I need to be brought to the hospital." Patient does report drinking tonight, she reports taking "6-7 shots" and that she normally drinks alcohol. Patient reports that she has been living with her boyfriend for 2 1/2 years "I'm only there to get his kids on the bus and pay half his rent." Patient reports some passive SI "I feel like it would be better if I wasn't here at all, I want to disappear and nobody knows me."    Patient's boyfriend's mother reports that "she was out of control, she wanted to drive home after she had been drinking, it  took 2 of [place] to get her here, there's a lot of drastic ups and downs even when she's sober."    Her current presentation of erratic behavior is most consistent with untreated depression and anxiety. She meets criteria for inpatient hospitalization  based on mood lability.  Current outpatient psychotropic medications include lexapro and hydroxyzine and historically she has had a "ok" response to these medications. She was non compliant with medications prior  to admission as evidenced by labile and erratic behavior. On initial examination, patient anxious, loud, and visibly upset. Please see plan below for detailed recommendations.   Diagnoses:  Active Hospital problems: Principal Problem:   Severe recurrent major depression without psychotic features (HCC) Active Problems:   GAD (generalized anxiety disorder)    Plan   ## Psychiatric Medication Recommendations:   Defer to inpatient psychiatrist to make medication recommendations  ## Medical Decision Making Capacity: Not specifically addressed in this encounter  ## Further Work-up:  Michaela Robinson was admitted to [facility] ER for Severe recurrent major depression without psychotic features (HCC), crisis management, and stabilization. Routine labs ordered, which include  Lab Orders         Ethanol         Salicylate level         Acetaminophen level         cbc         Urine Drug Screen, Qualitative         Comprehensive metabolic panel         POC urine preg, ED    Medication Management: Medications started  nicotine  14 mg Transdermal Once   Will maintain observation checks every 15 minutes for safety. Psychosocial education regarding relapse prevention and self-care; social and communication  Social work will consult with family for collateral information and discuss discharge and follow up plan.  ## Disposition:-- We recommend inpatient psychiatric hospitalization when medically cleared. Patient is under voluntary admission status at this time; please IVC if attempts to leave hospital.  ## Behavioral / Environmental: -To minimize splitting of staff, assign one staff person to communicate all information from the team when feasible. or Utilize compassion and acknowledge the patient's experiences while setting clear and realistic expectations for care.    ## Safety and Observation Level:  - Based on my clinical evaluation, I estimate the patient to be at moderate risk of self harm  in the current setting. - At this time, we recommend  routine. This decision is based on my review of the chart including patient's history and current presentation, interview of the patient, mental status examination, and consideration of suicide risk including evaluating suicidal ideation, plan, intent, suicidal or self-harm behaviors, risk factors, and protective factors. This judgment is based on our ability to directly address suicide risk, implement suicide prevention strategies, and develop a safety plan while the patient is in the clinical setting. Please contact our team if there is a concern that risk level has changed.  CSSR Risk Category:C-SSRS RISK CATEGORY: No Risk  Suicide Risk Assessment: Patient has following modifiable risk factors for suicide: active suicidal ideation, under treated depression , recklessness, and medication noncompliance, which we are addressing by recommending inpatient hospiatlization. Patient has following non-modifiable or demographic risk factors for suicide: history of self harm behavior and psychiatric hospitalization Patient has the following protective factors against suicide: Supportive family  Thank you for this consult request. Recommendations have been communicated to the primary team.  We will recommend inpatient  at this  time.   Jearld Lesch, NP       History of Present Illness  Relevant Aspects of Hospital ED Course:  Admitted on 04/17/2023 for erratic behavior. They are anxious and upset.  She repeatedly states that no one loves her and that she would be better off dead.   Patient Report:  Patient states she has a "shitty" life.  She says that her boyfriend should be here but he is not here.  She says she lives with her boyfriend but she says she cannot move out because she shares the rent.  She says everyone felt that she should have to be brought to the hospital based on her behavior.  The boyfriend's mom stated that she always has these  moods where they go up and down.  She does not think she should return home to her boyfriend and feels that the patient really needs help.  Patient reports poor sleep and poor appetite because she says she  too anxious and stressed to eat.  She reports taking medications such as Lexapro and hydroxyzine in the past but says she felt like a robot.   Psych ROS:  Depression: Current    Anxiety:  Current Mania (lifetime and current): current   Collateral information:  Contacted boyfriends mother  Review of Systems  All other systems reviewed and are negative.    Psychiatric and Social History  Psychiatric History:  Information collected from patient, chart review and boyfriends mother  Prev Dx/Sx: MDD, ADD, PTSD, and GAD Current Psych Provider: none Home Meds (current): none Previous Med Trials: lexapro, prozac Therapy: none  Prior Psych Hospitalization: yes  Prior Self Harm: yes Prior Violence: yes   Exam Findings  Physical Exam:  Vital Signs:  Temp:  [98.3 F (36.8 C)-98.7 F (37.1 C)] 98.7 F (37.1 C) (02/16 0240) Pulse Rate:  [105-120] 105 (02/16 0240) Resp:  [18-22] 18 (02/16 0240) BP: (112-140)/(57-88) 112/57 (02/16 0240) SpO2:  [98 %-100 %] 98 % (02/16 0240) Weight:  [56.7 kg] 56.7 kg (02/16 0240) Blood pressure (!) 112/57, pulse (!) 105, temperature 98.7 F (37.1 C), temperature source Oral, resp. rate 18, height 5' 3.5" (1.613 m), weight 56.7 kg, last menstrual period 04/14/2023, SpO2 98%. Body mass index is 21.8 kg/m.  Physical Exam Vitals and nursing note reviewed.  Constitutional:      Appearance: Normal appearance.  HENT:     Head: Normocephalic and atraumatic.     Nose: Nose normal.  Eyes:     Pupils: Pupils are equal, round, and reactive to light.  Pulmonary:     Effort: Pulmonary effort is normal.  Musculoskeletal:        General: Normal range of motion.     Cervical back: Normal range of motion.  Skin:    General: Skin is dry.  Neurological:      Mental Status: She is alert and oriented to person, place, and time.  Psychiatric:        Attention and Perception: Attention normal.        Mood and Affect: Mood is anxious and depressed. Affect is labile, angry and tearful.        Speech: Speech normal.        Behavior: Behavior is agitated, hyperactive and combative. Behavior is cooperative.        Thought Content: Thought content includes suicidal ideation. Thought content does not include suicidal plan.        Cognition and Memory: Cognition normal.  Judgment: Judgment is impulsive and inappropriate.     Mental Status Exam: General Appearance: Casual and Disheveled  Orientation:  Full (Time, Place, and Person)  Memory:  Immediate;   Fair  Concentration:  Concentration: Poor and Attention Span: Poor  Recall:  Fair  Attention  Poor  Eye Contact:  Fair  Speech:  Normal Rate  Language:  Fair  Volume:  Increased  Mood: angy, depressed  Affect:  Congruent, Labile, Full Range, and Tearful  Thought Process:  Coherent  Thought Content:  Illogical  Suicidal Thoughts:  Yes.  without intent/plan  Homicidal Thoughts:  No  Judgement:  Impaired  Insight:  Lacking  Psychomotor Activity:  Normal  Akathisia:  NA  Fund of Knowledge:  NA      Assets:  Manufacturing systems engineer Desire for Improvement Financial Resources/Insurance Housing Physical Health Social Support  Cognition:  WNL  ADL's:  Intact  AIMS (if indicated):        Other History   These have been pulled in through the EMR, reviewed, and updated if appropriate.  Family History:  The patient's family history is not on file.  Medical History: Past Medical History:  Diagnosis Date   Medical history non-contributory     Surgical History: History reviewed. No pertinent surgical history.   Medications:   Current Facility-Administered Medications:    nicotine (NICODERM CQ - dosed in mg/24 hours) patch 14 mg, 14 mg, Transdermal, Once, Loleta Rose,  MD  Current Outpatient Medications:    hydrOXYzine (VISTARIL) 25 MG capsule, Take 1 capsule (25 mg total) by mouth every 8 (eight) hours as needed., Disp: 60 capsule, Rfl: 2  Allergies: No Known Allergies  Ambika Zettlemoyer Damaris Hippo, NP
